# Patient Record
Sex: Female | Born: 1995 | Hispanic: No | Marital: Single | Smoking: Never smoker
Health system: Southern US, Community
[De-identification: ages and names within clinical notes are randomized; demographics above are authoritative.]

## PROBLEM LIST (undated history)

## (undated) DIAGNOSIS — R011 Cardiac murmur, unspecified: Secondary | ICD-10-CM

## (undated) HISTORY — PX: KNEE ARTHROSCOPY WITH ANTERIOR CRUCIATE LIGAMENT (ACL) REPAIR: SHX5644

---

## 2017-12-02 DIAGNOSIS — M25562 Pain in left knee: Secondary | ICD-10-CM | POA: Insufficient documentation

## 2017-12-02 DIAGNOSIS — M7918 Myalgia, other site: Secondary | ICD-10-CM | POA: Insufficient documentation

## 2017-12-03 ENCOUNTER — Encounter (HOSPITAL_COMMUNITY): Payer: Self-pay | Admitting: Emergency Medicine

## 2017-12-03 ENCOUNTER — Emergency Department (HOSPITAL_COMMUNITY): Payer: Medicaid Other

## 2017-12-03 ENCOUNTER — Emergency Department (HOSPITAL_COMMUNITY)
Admission: EM | Admit: 2017-12-03 | Discharge: 2017-12-03 | Disposition: A | Discharge status: Home / Self Care | Payer: Medicaid Other | Attending: Emergency Medicine | Admitting: Emergency Medicine

## 2017-12-03 ENCOUNTER — Other Ambulatory Visit: Payer: Self-pay

## 2017-12-03 DIAGNOSIS — M25562 Pain in left knee: Secondary | ICD-10-CM

## 2017-12-03 HISTORY — DX: Cardiac murmur, unspecified: R01.1

## 2017-12-03 MED ORDER — IBUPROFEN 200 MG PO TABS
600.0000 mg | ORAL_TABLET | Freq: Once | ORAL | Status: AC
  Administered 2017-12-03: 600 mg via ORAL
  Filled 2017-12-03: qty 3

## 2017-12-03 MED ORDER — METHOCARBAMOL 500 MG PO TABS: 500.0000 mg | ORAL_TABLET | Freq: Two times a day (BID) | ORAL | 0 refills | Status: AC

## 2017-12-03 MED ORDER — IBUPROFEN 600 MG PO TABS: 600.0000 mg | ORAL_TABLET | Freq: Four times a day (QID) | ORAL | 0 refills | Status: AC | PRN
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
## 2017-12-03 NOTE — Discharge Instructions (Addendum)
The pain your experiencing is likely due to muscle strain, you may take Ibuprofen and Robaxin as needed for pain management. Do not combine with any pain reliever other than tylenol. The muscle soreness should improve over the next week. Follow up with your family doctor in the next week for a recheck if you are still having symptoms. Return to ED if pain is worsening, you develop weakness or numbness of extremities, or new or concerning symptoms develop. °
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
